# Patient Record
Sex: Male | Born: 1995
Health system: Southern US, Community
[De-identification: ages and names within clinical notes are randomized; demographics above are authoritative.]

## PROBLEM LIST (undated history)

## (undated) DIAGNOSIS — F319 Bipolar disorder, unspecified: Secondary | ICD-10-CM

## (undated) DIAGNOSIS — F329 Major depressive disorder, single episode, unspecified: Secondary | ICD-10-CM

## (undated) DIAGNOSIS — K859 Acute pancreatitis without necrosis or infection, unspecified: Secondary | ICD-10-CM

## (undated) DIAGNOSIS — F32A Depression, unspecified: Secondary | ICD-10-CM

## (undated) DIAGNOSIS — F419 Anxiety disorder, unspecified: Secondary | ICD-10-CM

## (undated) DIAGNOSIS — Z87442 Personal history of urinary calculi: Secondary | ICD-10-CM

## (undated) HISTORY — DX: Anxiety disorder, unspecified: F41.9

## (undated) HISTORY — DX: Bipolar disorder, unspecified: F31.9

## (undated) HISTORY — PX: HERNIA REPAIR: SHX51

---
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
## 2015-05-25 DIAGNOSIS — F419 Anxiety disorder, unspecified: Secondary | ICD-10-CM | POA: Diagnosis not present

## 2015-05-25 DIAGNOSIS — F319 Bipolar disorder, unspecified: Secondary | ICD-10-CM | POA: Diagnosis not present

## 2015-05-25 DIAGNOSIS — R1010 Upper abdominal pain, unspecified: Secondary | ICD-10-CM | POA: Diagnosis not present

## 2015-05-27 DIAGNOSIS — F419 Anxiety disorder, unspecified: Secondary | ICD-10-CM | POA: Diagnosis not present

## 2015-05-27 DIAGNOSIS — F319 Bipolar disorder, unspecified: Secondary | ICD-10-CM | POA: Diagnosis not present

## 2015-05-27 DIAGNOSIS — R1010 Upper abdominal pain, unspecified: Secondary | ICD-10-CM | POA: Diagnosis not present

## 2015-06-25 DIAGNOSIS — Z6821 Body mass index (BMI) 21.0-21.9, adult: Secondary | ICD-10-CM | POA: Diagnosis not present

## 2015-06-25 DIAGNOSIS — R110 Nausea: Secondary | ICD-10-CM | POA: Diagnosis not present

## 2015-06-25 DIAGNOSIS — F3189 Other bipolar disorder: Secondary | ICD-10-CM | POA: Diagnosis not present

## 2015-07-15 DIAGNOSIS — F319 Bipolar disorder, unspecified: Secondary | ICD-10-CM | POA: Diagnosis not present

## 2015-07-28 DIAGNOSIS — F3189 Other bipolar disorder: Secondary | ICD-10-CM | POA: Diagnosis not present

## 2015-08-04 DIAGNOSIS — F3189 Other bipolar disorder: Secondary | ICD-10-CM | POA: Diagnosis not present

## 2015-08-20 DIAGNOSIS — F3189 Other bipolar disorder: Secondary | ICD-10-CM | POA: Diagnosis not present

## 2015-09-01 DIAGNOSIS — F3189 Other bipolar disorder: Secondary | ICD-10-CM | POA: Diagnosis not present

## 2015-09-08 DIAGNOSIS — F319 Bipolar disorder, unspecified: Secondary | ICD-10-CM | POA: Diagnosis not present

## 2015-09-09 DIAGNOSIS — F3189 Other bipolar disorder: Secondary | ICD-10-CM | POA: Diagnosis not present

## 2015-09-29 DIAGNOSIS — F3189 Other bipolar disorder: Secondary | ICD-10-CM | POA: Diagnosis not present

## 2016-01-18 DIAGNOSIS — F319 Bipolar disorder, unspecified: Secondary | ICD-10-CM | POA: Diagnosis not present

## 2016-07-06 DIAGNOSIS — F3189 Other bipolar disorder: Secondary | ICD-10-CM | POA: Diagnosis not present

## 2016-07-06 DIAGNOSIS — F4010 Social phobia, unspecified: Secondary | ICD-10-CM | POA: Diagnosis not present

## 2016-07-31 DIAGNOSIS — F3189 Other bipolar disorder: Secondary | ICD-10-CM | POA: Diagnosis not present

## 2016-07-31 DIAGNOSIS — F4010 Social phobia, unspecified: Secondary | ICD-10-CM | POA: Diagnosis not present

## 2017-01-19 DIAGNOSIS — F4010 Social phobia, unspecified: Secondary | ICD-10-CM | POA: Diagnosis not present

## 2017-01-19 DIAGNOSIS — F3189 Other bipolar disorder: Secondary | ICD-10-CM | POA: Diagnosis not present

## 2017-03-09 DIAGNOSIS — B029 Zoster without complications: Secondary | ICD-10-CM | POA: Diagnosis not present

## 2017-03-09 DIAGNOSIS — Z6824 Body mass index (BMI) 24.0-24.9, adult: Secondary | ICD-10-CM | POA: Diagnosis not present

## 2017-04-10 DIAGNOSIS — F4010 Social phobia, unspecified: Secondary | ICD-10-CM | POA: Diagnosis not present

## 2017-04-10 DIAGNOSIS — F3189 Other bipolar disorder: Secondary | ICD-10-CM | POA: Diagnosis not present

## 2017-04-26 DIAGNOSIS — S93409A Sprain of unspecified ligament of unspecified ankle, initial encounter: Secondary | ICD-10-CM | POA: Diagnosis not present

## 2017-04-26 DIAGNOSIS — M25571 Pain in right ankle and joints of right foot: Secondary | ICD-10-CM | POA: Diagnosis not present

## 2017-05-11 DIAGNOSIS — F4010 Social phobia, unspecified: Secondary | ICD-10-CM | POA: Diagnosis not present

## 2017-05-11 DIAGNOSIS — F3189 Other bipolar disorder: Secondary | ICD-10-CM | POA: Diagnosis not present

## 2017-07-31 DIAGNOSIS — F3189 Other bipolar disorder: Secondary | ICD-10-CM | POA: Diagnosis not present

## 2017-07-31 DIAGNOSIS — F4010 Social phobia, unspecified: Secondary | ICD-10-CM | POA: Diagnosis not present

## 2018-11-14 DIAGNOSIS — Z23 Encounter for immunization: Secondary | ICD-10-CM | POA: Diagnosis not present

## 2018-12-13 DIAGNOSIS — Z20828 Contact with and (suspected) exposure to other viral communicable diseases: Secondary | ICD-10-CM | POA: Diagnosis not present

## 2019-02-14 ENCOUNTER — Emergency Department (HOSPITAL_COMMUNITY): Payer: BC Managed Care – PPO

## 2019-02-14 ENCOUNTER — Emergency Department (HOSPITAL_COMMUNITY)
Admission: EM | Admit: 2019-02-14 | Discharge: 2019-02-14 | Disposition: A | Discharge status: Home / Self Care | Payer: BC Managed Care – PPO | Attending: Emergency Medicine | Admitting: Emergency Medicine

## 2019-02-14 ENCOUNTER — Encounter (HOSPITAL_COMMUNITY): Payer: Self-pay | Admitting: *Deleted

## 2019-02-14 DIAGNOSIS — R1084 Generalized abdominal pain: Secondary | ICD-10-CM | POA: Diagnosis not present

## 2019-02-14 DIAGNOSIS — R109 Unspecified abdominal pain: Secondary | ICD-10-CM | POA: Diagnosis not present

## 2019-02-14 DIAGNOSIS — N200 Calculus of kidney: Secondary | ICD-10-CM | POA: Insufficient documentation

## 2019-02-14 DIAGNOSIS — N201 Calculus of ureter: Secondary | ICD-10-CM | POA: Insufficient documentation

## 2019-02-14 DIAGNOSIS — F419 Anxiety disorder, unspecified: Secondary | ICD-10-CM | POA: Diagnosis not present

## 2019-02-14 DIAGNOSIS — Z79899 Other long term (current) drug therapy: Secondary | ICD-10-CM | POA: Insufficient documentation

## 2019-02-14 DIAGNOSIS — N50812 Left testicular pain: Secondary | ICD-10-CM | POA: Diagnosis not present

## 2019-02-14 DIAGNOSIS — N202 Calculus of kidney with calculus of ureter: Secondary | ICD-10-CM | POA: Diagnosis not present

## 2019-02-14 DIAGNOSIS — R1032 Left lower quadrant pain: Secondary | ICD-10-CM | POA: Diagnosis not present

## 2019-02-14 LAB — CBC WITH DIFFERENTIAL/PLATELET
Abs Immature Granulocytes: 0.03 10*3/uL (ref 0.00–0.07)
Basophils Absolute: 0.1 10*3/uL (ref 0.0–0.1)
Basophils Relative: 1 %
Eosinophils Absolute: 0.1 10*3/uL (ref 0.0–0.5)
Eosinophils Relative: 2 %
HCT: 49.6 % (ref 39.0–52.0)
Hemoglobin: 16.9 g/dL (ref 13.0–17.0)
Immature Granulocytes: 0 %
Lymphocytes Relative: 35 %
Lymphs Abs: 2.8 10*3/uL (ref 0.7–4.0)
MCH: 30.1 pg (ref 26.0–34.0)
MCHC: 34.1 g/dL (ref 30.0–36.0)
MCV: 88.3 fL (ref 80.0–100.0)
Monocytes Absolute: 0.8 10*3/uL (ref 0.1–1.0)
Monocytes Relative: 10 %
Neutro Abs: 4.2 10*3/uL (ref 1.7–7.7)
Neutrophils Relative %: 52 %
Platelets: 267 10*3/uL (ref 150–400)
RBC: 5.62 MIL/uL (ref 4.22–5.81)
RDW: 12.3 % (ref 11.5–15.5)
WBC: 8 10*3/uL (ref 4.0–10.5)
nRBC: 0 % (ref 0.0–0.2)

## 2019-02-14 LAB — LIPASE, BLOOD: Lipase: 28 U/L (ref 11–51)

## 2019-02-14 LAB — URINALYSIS, ROUTINE W REFLEX MICROSCOPIC
Bilirubin Urine: NEGATIVE
Glucose, UA: NEGATIVE mg/dL
Ketones, ur: 5 mg/dL — AB
Leukocytes,Ua: NEGATIVE
Nitrite: NEGATIVE
Protein, ur: 30 mg/dL — AB
RBC / HPF: >50 RBC/hpf — ABNORMAL HIGH (ref 0–5)
Specific Gravity, Urine: 1.016 (ref 1.005–1.030)
pH: 7 (ref 5.0–8.0)

## 2019-02-14 LAB — COMPREHENSIVE METABOLIC PANEL
ALT: 56 U/L — ABNORMAL HIGH (ref 0–44)
AST: 52 U/L — ABNORMAL HIGH (ref 15–41)
Albumin: 4.6 g/dL (ref 3.5–5.0)
Alkaline Phosphatase: 86 U/L (ref 38–126)
Anion gap: 13 (ref 5–15)
BUN: 13 mg/dL (ref 6–20)
CO2: 22 mmol/L (ref 22–32)
Calcium: 9.7 mg/dL (ref 8.9–10.3)
Chloride: 105 mmol/L (ref 98–111)
Creatinine, Ser: 1.27 mg/dL — ABNORMAL HIGH (ref 0.61–1.24)
GFR calc Af Amer: >60 mL/min (ref >60)
GFR calc non Af Amer: >60 mL/min (ref >60)
Glucose, Bld: 134 mg/dL — ABNORMAL HIGH (ref 70–99)
Potassium: 5 mmol/L (ref 3.5–5.1)
Sodium: 140 mmol/L (ref 135–145)
Total Bilirubin: 1.6 mg/dL — ABNORMAL HIGH (ref 0.3–1.2)
Total Protein: 6.7 g/dL (ref 6.5–8.1)

## 2019-02-14 MED ORDER — IOHEXOL 350 MG/ML SOLN
100.0000 mL | Freq: Once | INTRAVENOUS | Status: AC | PRN
  Administered 2019-02-14: 100 mL via INTRAVENOUS

## 2019-02-14 MED ORDER — ONDANSETRON HCL 4 MG/2ML IJ SOLN
4.0000 mg | Freq: Once | INTRAMUSCULAR | Status: DC
  Filled 2019-02-14: qty 2

## 2019-02-14 MED ORDER — TAMSULOSIN HCL 0.4 MG PO CAPS: 0.4000 mg | ORAL_CAPSULE | Freq: Every day | ORAL | 0 refills | Status: AC

## 2019-02-14 MED ORDER — MORPHINE SULFATE (PF) 4 MG/ML IV SOLN
4.0000 mg | Freq: Once | INTRAVENOUS | Status: DC
  Filled 2019-02-14: qty 1

## 2019-02-14 MED ORDER — SODIUM CHLORIDE 0.9 % IV BOLUS
1000.0000 mL | Freq: Once | INTRAVENOUS | Status: AC
  Administered 2019-02-14: 12:00:00 1000 mL via INTRAVENOUS

## 2019-02-14 MED ORDER — ONDANSETRON 4 MG PO TBDP: ORAL_TABLET | ORAL | 0 refills | Status: AC

## 2019-02-14 MED ORDER — OXYCODONE-ACETAMINOPHEN 5-325 MG PO TABS: 1.0000 | ORAL_TABLET | Freq: Four times a day (QID) | ORAL | 0 refills | Status: AC | PRN

## 2019-02-14 MED ORDER — KETOROLAC TROMETHAMINE 15 MG/ML IJ SOLN
15.0000 mg | Freq: Once | INTRAMUSCULAR | Status: AC
  Administered 2019-02-14: 15 mg via INTRAVENOUS
  Filled 2019-02-14: qty 1

## 2019-02-14 NOTE — ED Provider Notes (Signed)
[facility] EMERGENCY DEPARTMENT Provider Note   CSN: [id number] Arrival date & time: 02/14/19  7782     History Chief Complaint  Patient presents with  . Abdominal Pain    Anthony Villegas is a 24 y.o. male.  Anthony Villegas is a 24 y.o. male with a history of pancreatitis, bipolar affective disorder, and depression, who presents to the emergency department for evaluation of left lower quadrant abdominal pain.  Patient states that symptoms first started yesterday with some mild pain in this area.  Patient states when he woke up this morning pain was severe and he also developed pain in his left testicle.  Pain radiates into his back as well.  Patient is crying out in pain and is very anxious.  He states he is nauseated, and has a phobia of vomiting.  He states that he took a laxative yesterday because he thought constipation was contributing to his symptoms but otherwise has not had any diarrhea or blood in the stool.  States that he has had stomach issues in the past and has been diagnosed with likely IBS but has never had abdominal pain this severe, he does not usually have testicle pain associated with his abdomen.  He denies any dysuria or urinary frequency, no hematuria, no penile discharge.  He has not noted any testicular swelling.  States that he was diagnosed with appendicitis once before but told that it was mild and did not have surgery.  Patient states that even though the pain is not on his right side he is worried he could have appendicitis again.  He has not taken anything to treat his pain prior to arrival.        Past Medical History:  Diagnosis Date  . Anxiety   . Bipolar affective disorder   . Depression    November to August talk therapy  . Pancreatitis    3 years ago episode but no problems since    Patient Active Problem List   Diagnosis Date Noted  . Constipation 01/09/2011  . Abdominal pain 01/09/2011    Past Surgical History:    Procedure Laterality Date  . HERNIA REPAIR     testical       Family History  Problem Relation Age of Onset  . Diabetes Father   . Irritable bowel syndrome Unknown   . Hypertension Father   . GER disease Unknown   . Colon cancer Unknown        ??  . Gout Father   . Arthritis Father   . Kidney Stones Father   . Diabetes Mother   . Hypertension Mother     Social History   Tobacco Use  . Smoking status: Never Smoker  Substance Use Topics  . Alcohol use: No  . Drug use: No    Home Medications Prior to Admission medications   Medication Sig Start Date End Date Taking? Authorizing Provider  ALPRAZolam Prudy Feeler) 0.25 MG tablet Take one tablet at 2:30pm on the day of the procedure. 03/18/14   Hilarie Fredrickson, MD  ARIPiprazole (ABILIFY) 10 MG tablet Take 10 mg by mouth daily.    [provider]  clidinium-chlordiazePOXIDE (LIBRAX) 5-2.5 MG per capsule TAKE ONE CAPSULE BY MOUTH 15 MINUTES BEFORE MEALS FOR ABDOMINAL PAIN AND CRAMPING 04/13/14   Hilarie Fredrickson, MD  ondansetron (ZOFRAN ODT) 4 MG disintegrating tablet Take 1 tablet (4 mg total) by mouth every 8 (eight) hours as needed for nausea or vomiting.  03/31/14   Britt Bottom, NP    Allergies    Patient has no known allergies.  Review of Systems   Review of Systems  Constitutional: Negative for chills and fever.  HENT: Negative.   Respiratory: Negative for cough and shortness of breath.   Cardiovascular: Negative for chest pain.  Gastrointestinal: Positive for abdominal pain and nausea. Negative for blood in stool, constipation, diarrhea and vomiting.  Genitourinary: Positive for testicular pain. Negative for dysuria, frequency, penile swelling and scrotal swelling.  Musculoskeletal: Positive for back pain.  Skin: Negative for color change and rash.  Neurological: Negative for dizziness, syncope and light-headedness.    Physical Exam Updated Vital Signs BP 137/88 (BP Location: Right Arm)   Pulse 86   Temp  97.6 F (36.4 C) (Oral)   Resp (!) 22   SpO2 100%   Physical Exam Vitals and nursing note reviewed. Exam conducted with a chaperone present.  Constitutional:      General: He is in acute distress.     Appearance: He is well-developed. He is not ill-appearing or diaphoretic.     Comments: Patient appears uncomfortable and somewhat distressed, crying out in pain but is not ill or toxic appearing  HENT:     Head: Normocephalic and atraumatic.  Eyes:     General:        Right eye: No discharge.        Left eye: No discharge.     Pupils: Pupils are equal, round, and reactive to light.  Cardiovascular:     Rate and Rhythm: Normal rate and regular rhythm.     Heart sounds: Normal heart sounds.  Pulmonary:     Effort: Pulmonary effort is normal. No respiratory distress.     Breath sounds: Normal breath sounds. No wheezing or rales.     Comments: Respirations equal and unlabored, patient able to speak in full sentences, lungs clear to auscultation bilaterally Abdominal:     General: Bowel sounds are normal. There is no distension.     Palpations: Abdomen is soft. There is no mass.     Tenderness: There is generalized abdominal tenderness and tenderness in the left lower quadrant. There is no guarding.     Comments: Abdomen is soft and nondistended, bowel sounds present throughout, patient endorses some generalized tenderness in all quadrants but cries out with palpation in the left lower quadrant with some voluntary guarding noted  Genitourinary:    Comments: Penis normal, testicles and scrotum without erythema or swelling, patient does endorse some pain with tenderness over the left testicle, no palpable masses. Musculoskeletal:        General: No deformity.     Cervical back: Neck supple.  Skin:    General: Skin is warm and dry.     Capillary Refill: Capillary refill takes less than 2 seconds.  Neurological:     Mental Status: He is alert.     Coordination: Coordination normal.      Comments: Speech is clear, able to follow commands Moves extremities without ataxia, coordination intact  Psychiatric:        Mood and Affect: Mood normal.        Behavior: Behavior normal.     ED Results / Procedures / Treatments   Labs (all labs ordered are listed, but only abnormal results are displayed) Labs Reviewed  COMPREHENSIVE METABOLIC PANEL - Abnormal; Notable for the following components:      Result Value   Glucose, Bld 134 (*)  Creatinine, Ser 1.27 (*)    AST 52 (*)    ALT 56 (*)    Total Bilirubin 1.6 (*)    All other components within normal limits  LIPASE, BLOOD  CBC WITH DIFFERENTIAL/PLATELET  URINALYSIS, ROUTINE W REFLEX MICROSCOPIC    EKG None  Radiology CT Abdomen Pelvis W Contrast  Result Date: 02/14/2019 CLINICAL DATA:  Left lower abdominal pain EXAM: CT ABDOMEN AND PELVIS WITH CONTRAST TECHNIQUE: Multidetector CT imaging of the abdomen and pelvis was performed using the standard protocol following bolus administration of intravenous contrast. CONTRAST:  OMNIPAQUE IOHEXOL 350 MG/ML SOLN COMPARISON:  January 12, 2013 FINDINGS: Lower chest: Lung bases are clear. Hepatobiliary: There is hepatic steatosis. No focal liver lesions are evident. The gallbladder wall is not appreciably thickened. There is no biliary duct dilatation. Pancreas: There is no pancreatic mass or inflammatory focus. Spleen: No splenic lesions are evident. Adrenals/Urinary Tract: Adrenals bilaterally appear unremarkable. Kidneys bilaterally show no evident mass. There is no appreciable hydronephrosis on either side. There is a 1 mm calculus in the mid left kidney with a second 1 mm calculus in the lower pole left kidney. There is a calculus in the proximal left ureter slightly beyond the left ureteropelvic junction measuring 3 x 2 mm. There is subtle edema of the left ureter at the site of the calculus. No other ureteral calculi are evident on either side. Urinary bladder is midline with  wall thickness within normal limits. Stomach/Bowel: There is no appreciable bowel wall or mesenteric thickening. There is no evident bowel obstruction. The terminal ileum appears unremarkable. There is no demonstrable free air or portal venous air. Vascular/Lymphatic: There is no abdominal aortic aneurysm. No vascular lesions are evident. Major venous structures are patent. There is no adenopathy in the abdomen or pelvis. Reproductive: Prostate and seminal vesicles are normal in size and contour. No evident pelvic mass. Other: Appendix appears unremarkable. No abscess or ascites is evident in the abdomen or pelvis. Musculoskeletal: No blastic or lytic bone lesions. 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 No intramuscular or abdominal wall lesions are evident. IMPRESSION: 1. 3 x 2 mm calculus in the left ureter slightly beyond the left ureteropelvic junction. There is slight edema involving the left ureter at this site. There is no appreciable hydronephrosis, however. 2. There is a 1 mm calculus in the mid left kidney as well as a 1 mm calculus in the lower pole left kidney. 3. No bowel obstruction. No abscess in the abdomen or pelvis. Appendix appears normal. 4.  Hepatic steatosis. 5.  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 Electronically Signed   By: Bretta Bang III M.D.   On: 02/14/2019 12:12   US SCROTUM W/DOPPLER  Result Date: 02/14/2019 CLINICAL DATA:  24 year old male with left testicular pain since yesterday. EXAM: SCROTAL ULTRASOUND DOPPLER ULTRASOUND OF THE TESTICLES TECHNIQUE: Complete ultrasound examination of the testicles, epididymis, and other scrotal structures was performed. Color and spectral Doppler ultrasound were also utilized to evaluate blood flow to the testicles. COMPARISON:  CT Abdomen and Pelvis 01/12/2013. FINDINGS: Right testicle Measurements: 4.7 x 2.3 x 2.5 centimeters. Mild microlithiasis (image 15). Otherwise normal testicular echotexture. Left  testicle Measurements: 4.5 x 2.4 x 2.8 centimeters cyst. Mild microlithiasis similar to the right side (image 33) with otherwise normal echotexture. Right epididymis:  Normal in size and appearance. Left epididymis:  Normal in size and appearance. Hydrocele:  None visualized. Varicocele:  None visualized. Pulsed Doppler interrogation of both testes  demonstrates normal low resistance arterial and venous waveforms bilaterally. IMPRESSION: No evidence of testicular torsion and normal scrotal ultrasound aside from mild bilateral testicular microlithiasis. Current literature suggests that testicular microlithiasis is not a significant independent risk factor for development of testicular carcinoma, and that follow up imaging is not warranted in the absence of other risk factors. Monthly testicular self-examination and annual physical exams are considered appropriate surveillance. If patient has other risk factors for testicular carcinoma, then referral to Urology should be considered. (Reference: DeCastro, et al.: A 5-Year Follow up Study of Asymptomatic Men with Testicular Microlithiasis. J Urol 2008; 179:1420-1423.) Electronically Signed   By: Odessa Fleming M.D.   On: 02/14/2019 11:08    Procedures Procedures (including critical care time)  Medications Ordered in ED Medications  sodium chloride 0.9 % bolus 1,000 mL (has no administration in time range)  ondansetron (ZOFRAN) injection 4 mg (has no administration in time range)  morphine 4 MG/ML injection 4 mg (has no administration in time range)    ED Course  I have reviewed the triage vital signs and the nursing notes.  Pertinent labs & imaging results that were available during my care of the patient were reviewed by me and considered in my medical decision making (see chart for details).  Clinical Course as of Feb 13 1230  Fri Feb 14, 2019  5730 25 year old male presents with left lower quadrant abdominal pain and left-sided testicular pain, pain  radiates to the back.  Patient has generalized abdominal tenderness throughout that is worse in the left lower quadrant with mild guarding he also has some testicular tenderness.  Differential includes testicular torsion, kidney stone, diverticulitis, pancreatitis, given location feel appendicitis is less likely, will get CT abdomen pelvis as well as testicular ultrasound and labs.  Pain medicine ordered for symptom management.   [KF]  1040 No leukocytosis  CBC with Differential [KF]  1117 Slight bump in creatinine, 1.27 usually 0.9, patient also has mildly elevated LFTs and T bili, no significant electrolyte derangements  Comprehensive metabolic panel(!) [KF]  1117 Normal lipase, does not suggest pancreatitis  Lipase, blood [KF]  1118 No evidence of testicular torsion or other acute abnormalities  US SCROTUM W/DOPPLER [KF]  1216 CT shows a 3 x 2 mm kidney stone just beyond the left ureteropelvic junction that is likely the cause of patient's symptoms.  Initial wave of severe pain the patient was experiencing in triage seems to have eased off and patient declined morphine, will order small dose of Toradol given slight bump in creatinine.  Awaiting urinalysis  CT Abdomen Pelvis W Contrast [KF]    Clinical Course User Index [KF] Dartha Lodge, PA-C   MDM Rules/Calculators/A&P                      Pt has been diagnosed with a Kidney Stone via CT. There is no evidence of significant hydronephrosis, serum creatinine minimally elevated, vitals sign stable and the pt does not have irratractable vomiting. Pt will be dc home with pain medications & has been advised to follow up with urology.   Final Clinical Impression(s) / ED Diagnoses Final diagnoses:  Left ureteral stone  Kidney stone on left side    Rx / DC Orders ED Discharge Orders         Ordered    tamsulosin (FLOMAX) 0.4 MG CAPS capsule  Daily     02/14/19 1258    ondansetron (ZOFRAN ODT) 4 MG disintegrating tablet  02/14/19 1258     oxyCODONE-acetaminophen (PERCOCET) 5-325 MG tablet  Every 6 hours PRN     02/14/19 1258           Dartha LodgeFord, Aldora Perman N, PA-C 02/14/19 1323    Virgina Norfolkuratolo, Rourke, DO 02/14/19 1557

## 2019-02-14 NOTE — ED Triage Notes (Signed)
LLQ pain since yesterday. No vomiting. Pt crying out in pain. Pain is constant with periods of more intensity. Pt took a laxative last night. Also complains of back pain

## 2019-02-14 NOTE — Discharge Instructions (Signed)
You have a left-sided kidney stone that is causing your symptoms, your scan also showed 2 smaller stones within the left kidney, your lab work looks good aside from a slight elevation in your kidney function this should be rechecked in about a week.  Please follow-up with urology regarding the stones.  This should pass easily on its own over the next few days.  Use prescribed pain medication and nausea medication for symptom management and take Flomax daily to help ease the passing of stone.  Return to the ED if you develop fevers, worsening pain despite pain medication, persistent vomiting or any other new or concerning symptoms.

## 2019-03-07 ENCOUNTER — Other Ambulatory Visit (HOSPITAL_COMMUNITY)
Admission: RE | Admit: 2019-03-07 | Discharge: 2019-03-07 | Disposition: A | Discharge status: Home / Self Care | Payer: BC Managed Care – PPO | Source: Ambulatory Visit | Attending: Urology | Admitting: Urology

## 2019-03-07 ENCOUNTER — Other Ambulatory Visit: Payer: Self-pay

## 2019-03-07 ENCOUNTER — Other Ambulatory Visit: Payer: Self-pay | Admitting: Urology

## 2019-03-07 ENCOUNTER — Encounter (HOSPITAL_BASED_OUTPATIENT_CLINIC_OR_DEPARTMENT_OTHER): Payer: Self-pay | Admitting: Urology

## 2019-03-07 DIAGNOSIS — Z20822 Contact with and (suspected) exposure to covid-19: Secondary | ICD-10-CM | POA: Insufficient documentation

## 2019-03-07 DIAGNOSIS — N201 Calculus of ureter: Secondary | ICD-10-CM | POA: Diagnosis not present

## 2019-03-07 DIAGNOSIS — Z01812 Encounter for preprocedural laboratory examination: Secondary | ICD-10-CM | POA: Diagnosis not present

## 2019-03-07 DIAGNOSIS — N202 Calculus of kidney with calculus of ureter: Secondary | ICD-10-CM | POA: Diagnosis not present

## 2019-03-07 DIAGNOSIS — N200 Calculus of kidney: Secondary | ICD-10-CM

## 2019-03-07 LAB — SARS CORONAVIRUS 2 (TAT 6-24 HRS): SARS Coronavirus 2: NEGATIVE

## 2019-03-10 ENCOUNTER — Encounter (HOSPITAL_BASED_OUTPATIENT_CLINIC_OR_DEPARTMENT_OTHER): Payer: Self-pay | Admitting: Urology

## 2019-03-10 ENCOUNTER — Other Ambulatory Visit: Payer: Self-pay

## 2019-03-10 ENCOUNTER — Encounter (HOSPITAL_BASED_OUTPATIENT_CLINIC_OR_DEPARTMENT_OTHER)
Admission: RE | Disposition: A | Discharge status: Home / Self Care | Payer: Self-pay | Source: Home / Self Care | Attending: Urology

## 2019-03-10 ENCOUNTER — Ambulatory Visit (HOSPITAL_BASED_OUTPATIENT_CLINIC_OR_DEPARTMENT_OTHER)
Admission: RE | Admit: 2019-03-10 | Discharge: 2019-03-10 | Disposition: A | Discharge status: Home / Self Care | Payer: BC Managed Care – PPO | Attending: Urology | Admitting: Urology

## 2019-03-10 ENCOUNTER — Ambulatory Visit (HOSPITAL_COMMUNITY): Payer: BC Managed Care – PPO

## 2019-03-10 DIAGNOSIS — N200 Calculus of kidney: Secondary | ICD-10-CM

## 2019-03-10 DIAGNOSIS — N201 Calculus of ureter: Secondary | ICD-10-CM | POA: Diagnosis not present

## 2019-03-10 DIAGNOSIS — N23 Unspecified renal colic: Secondary | ICD-10-CM | POA: Diagnosis not present

## 2019-03-10 DIAGNOSIS — Z01818 Encounter for other preprocedural examination: Secondary | ICD-10-CM | POA: Diagnosis not present

## 2019-03-10 DIAGNOSIS — N135 Crossing vessel and stricture of ureter without hydronephrosis: Secondary | ICD-10-CM

## 2019-03-10 HISTORY — DX: Personal history of urinary calculi: Z87.442

## 2019-03-10 HISTORY — PX: EXTRACORPOREAL SHOCK WAVE LITHOTRIPSY: SHX1557

## 2019-03-10 SURGERY — LITHOTRIPSY, ESWL
Anesthesia: LOCAL | Laterality: Left

## 2019-03-10 MED ORDER — DIPHENHYDRAMINE HCL 25 MG PO CAPS
ORAL_CAPSULE | ORAL | Status: AC
  Filled 2019-03-10: qty 1

## 2019-03-10 MED ORDER — CIPROFLOXACIN HCL 500 MG PO TABS
500.0000 mg | ORAL_TABLET | ORAL | Status: AC
  Administered 2019-03-10: 500 mg via ORAL
  Filled 2019-03-10: qty 1

## 2019-03-10 MED ORDER — DIAZEPAM 5 MG PO TABS
10.0000 mg | ORAL_TABLET | ORAL | Status: AC
  Administered 2019-03-10: 10 mg via ORAL
  Filled 2019-03-10: qty 2

## 2019-03-10 MED ORDER — DIPHENHYDRAMINE HCL 25 MG PO CAPS
25.0000 mg | ORAL_CAPSULE | ORAL | Status: AC
  Administered 2019-03-10: 25 mg via ORAL
  Filled 2019-03-10: qty 1

## 2019-03-10 MED ORDER — SODIUM CHLORIDE 0.9 % IV SOLN
INTRAVENOUS | Status: DC
  Filled 2019-03-10: qty 1000

## 2019-03-10 MED ORDER — DIAZEPAM 5 MG PO TABS
ORAL_TABLET | ORAL | Status: AC
  Filled 2019-03-10: qty 2

## 2019-03-10 MED ORDER — CIPROFLOXACIN HCL 500 MG PO TABS
ORAL_TABLET | ORAL | Status: AC
  Filled 2019-03-10: qty 1

## 2019-03-10 NOTE — H&P (Signed)
H&P  Chief Complaint: Lt kidney stone  History of Present Illness: 24 yo male presents for ESL  Of a 4 mm lt proximal ureteral stone.  Past Medical History:  Diagnosis Date  . Anxiety   . Bipolar affective disorder (HCC)   . Depression    November to August talk therapy  . Pancreatitis    3 years ago episode but no problems since    Past Surgical History:  Procedure Laterality Date  . HERNIA REPAIR     testical    Home Medications:  Allergies as of 03/10/2019   No Known Allergies     Medication List    Notice   Cannot display discharge medications because the patient has not yet been admitted.     Allergies: No Known Allergies  Family History  Problem Relation Age of Onset  . Diabetes Father   . Hypertension Father   . Gout Father   . Arthritis Father   . Kidney Stones Father   . Irritable bowel syndrome Other   . GER disease Other   . Colon cancer Other        ??  . Diabetes Mother   . Hypertension Mother     Social History:  reports that he has never smoked. He has never used smokeless tobacco. He reports that he does not drink alcohol or use drugs.  ROS: A complete review of systems was performed.  All systems are negative except for pertinent findings as noted.  Physical Exam:  Vital signs in last 24 hours: There were no vitals taken for this visit. Constitutional:  Alert and oriented, No acute distress Cardiovascular: Regular rate  Respiratory: Normal respiratory effort GI: Abdomen is soft, nontender, nondistended, no abdominal masses. No CVAT.  Genitourinary: Normal male phallus, testes are descended bilaterally and non-tender and without masses, scrotum is normal in appearance without lesions or masses, perineum is normal on inspection. Lymphatic: No lymphadenopathy Neurologic: Grossly intact, no focal deficits Psychiatric: Normal mood and affect  Laboratory Data:  No results for input(s): WBC, HGB, HCT, PLT in the last 72 hours.  No results  for input(s): NA, K, CL, GLUCOSE, BUN, CALCIUM, CREATININE in the last 72 hours.  Invalid input(s): CO3   No results found for this or any previous visit (from the past 24 hour(s)). Recent Results (from the past 240 hour(s))  SARS CORONAVIRUS 2 (TAT 6-24 HRS) Nasopharyngeal Nasopharyngeal Swab     Status: None   Collection Time: 03/07/19  2:20 PM   Specimen: Nasopharyngeal Swab  Result Value Ref Range Status   SARS Coronavirus 2 NEGATIVE NEGATIVE Final    Comment: (NOTE) SARS-CoV-2 target nucleic acids are NOT DETECTED. The SARS-CoV-2 RNA is generally detectable in upper and lower respiratory specimens during the acute phase of infection. Negative results do not preclude SARS-CoV-2 infection, do not rule out co-infections with other pathogens, and should not be used as the sole basis for treatment or other patient management decisions. Negative results must be combined with clinical observations, patient history, and epidemiological information. The expected result is Negative. Fact Sheet for Patients: HairSlick.no Fact Sheet for Healthcare Providers: quierodirigir.com This test is not yet approved or cleared by the [place] FDA and  has been authorized for detection and/or diagnosis of SARS-CoV-2 by FDA under an Emergency Use Authorization (EUA). This EUA will remain  in effect (meaning this test can be used) for the duration of the COVID-19 declaration under Section 56 4(b)(1) of the Act, 21 U.S.C. section  360bbb-3(b)(1), unless the authorization is terminated or revoked sooner. Performed at [facility], [place] [address], [place], [place] [address]     Renal Function: No results for input(s): CREATININE in the last 168 hours. CrCl cannot be calculated (Patient's most recent lab result is older than the maximum 21 days allowed.).  Radiologic Imaging: No results found.  Impression/Assessment:  Lt proximal  ureteral stone  Plan:  ESL. The risks, benefits and alternatives of LEFT ESWL was discussed with the patient. I described the risks which include arrhythmia, kidney contusion, kidney hemorrhage, need for transfusion, back discomfort, flank ecchymosis, flank abrasion, inability to break up stone, inability to pass stone fragments, Steinstrasse, infection associated with obstructing stones, need for different surgical procedure and possible need for repeat shockwave lithotripsy.  He desires to proceed.

## 2019-03-10 NOTE — Discharge Instructions (Signed)
Post Anesthesia Home Care Instructions  Activity: Get plenty of rest for the remainder of the day. A responsible adult should stay with you for 24 hours following the procedure.  For the next 24 hours, DO NOT: -Drive a car -Operate machinery -Drink alcoholic beverages -Take any medication unless instructed by your physician -Make any legal decisions or sign important papers.  Meals: Start with liquid foods such as gelatin or soup. Progress to regular foods as tolerated. Avoid greasy, spicy, heavy foods. If nausea and/or vomiting occur, drink only clear liquids until the nausea and/or vomiting subsides. Call your physician if vomiting continues.  Special Instructions/Symptoms: Your throat may feel dry or sore from the anesthesia or the breathing tube placed in your throat during surgery. If this causes discomfort, gargle with warm salt water. The discomfort should disappear within 24 hours.  If you had a scopolamine patch placed behind your ear for the management of post- operative nausea and/or vomiting:  1. The medication in the patch is effective for 72 hours, after which it should be removed.  Wrap patch in a tissue and discard in the trash. Wash hands thoroughly with soap and water. 2. You may remove the patch earlier than 72 hours if you experience unpleasant side effects which may include dry mouth, dizziness or visual disturbances. 3. Avoid touching the patch. Wash your hands with soap and water after contact with the patch.    Lithotripsy, Care After This sheet gives you information about how to care for yourself after your procedure. Your health care provider may also give you more specific instructions. If you have problems or questions, contact your health care provider. What can I expect after the procedure? After the procedure, it is common to have:  Some blood in your urine. This should only last for a few days.  Soreness in your back, sides, or upper abdomen for a few  days.  Blotches or bruises on your back where the pressure wave entered the skin.  Pain, discomfort, or nausea when pieces (fragments) of the kidney stone move through the tube that carries urine from the kidney to the bladder (ureter). Stone fragments may pass soon after the procedure, but they may continue to pass for up to 4-8 weeks. ? If you have severe pain or nausea, contact your health care provider. This may be caused by a large stone that was not broken up, and this may mean that you need more treatment.  Some pain or discomfort during urination.  Some pain or discomfort in the lower abdomen or (in men) at the base of the penis. Follow these instructions at home: Medicines  Take over-the-counter and prescription medicines only as told by your health care provider.  If you were prescribed an antibiotic medicine, take it as told by your health care provider. Do not stop taking the antibiotic even if you start to feel better.  Do not drive for 24 hours if you were given a medicine to help you relax (sedative).  Do not drive or use heavy machinery while taking prescription pain medicine. Eating and drinking      Drink enough water and fluids to keep your urine clear or pale yellow. This helps any remaining pieces of the stone to pass. It can also help prevent new stones from forming.  Eat plenty of fresh fruits and vegetables.  Follow instructions from your health care provider about eating and drinking restrictions. You may be instructed: ? To reduce how much salt (sodium) you   eat or drink. Check ingredients and nutrition facts on packaged foods and beverages. ? To reduce how much meat you eat.  Eat the recommended amount of calcium for your age and gender. Ask your health care provider how much calcium you should have. General instructions  Get plenty of rest.  Most people can resume normal activities 1-2 days after the procedure. Ask your health care provider what  activities are safe for you.  Your health care provider may direct you to lie in a certain position (postural drainage) and tap firmly (percuss) over your kidney area to help stone fragments pass. Follow instructions as told by your health care provider.  If directed, strain all urine through the strainer that was provided by your health care provider. ? Keep all fragments for your health care provider to see. Any stones that are found may be sent to a medical lab for examination. The stone may be as small as a grain of salt.  Keep all follow-up visits as told by your health care provider. This is important. Contact a health care provider if:  You have pain that is severe or does not get better with medicine.  You have nausea that is severe or does not go away.  You have blood in your urine longer than your health care provider told you to expect.  You have more blood in your urine.  You have pain during urination that does not go away.  You urinate more frequently than usual and this does not go away.  You develop a rash or any other possible signs of an allergic reaction. Get help right away if:  You have severe pain in your back, sides, or upper abdomen.  You have severe pain while urinating.  Your urine is very dark red.  You have blood in your stool (feces).  You cannot pass any urine at all.  You feel a strong urge to urinate after emptying your bladder.  You have a fever or chills.  You develop shortness of breath, difficulty breathing, or chest pain.  You have severe nausea that leads to persistent vomiting.  You faint. Summary  After this procedure, it is common to have some pain, discomfort, or nausea when pieces (fragments) of the kidney stone move through the tube that carries urine from the kidney to the bladder (ureter). If this pain or nausea is severe, however, you should contact your health care provider.  Most people can resume normal activities 1-2  days after the procedure. Ask your health care provider what activities are safe for you.  Drink enough water and fluids to keep your urine clear or pale yellow. This helps any remaining pieces of the stone to pass, and it can help prevent new stones from forming.  If directed, strain your urine and keep all fragments for your health care provider to see. Fragments or stones may be as small as a grain of salt.  Get help right away if you have severe pain in your back, sides, or upper abdomen or have severe pain while urinating. This information is not intended to replace advice given to you by your health care provider. Make sure you discuss any questions you have with your health care provider. Document Revised: 05/06/2018 Document Reviewed: 12/15/2015 Elsevier Patient Education  2020 [employer] See [facility] discharge instructions in chart. 

## 2019-03-10 NOTE — Op Note (Signed)
See [facility] note scanned into chart. 

## 2019-05-08 DIAGNOSIS — L700 Acne vulgaris: Secondary | ICD-10-CM | POA: Diagnosis not present

## 2019-09-05 DIAGNOSIS — Z20822 Contact with and (suspected) exposure to covid-19: Secondary | ICD-10-CM | POA: Diagnosis not present

## 2020-01-05 DIAGNOSIS — Z20822 Contact with and (suspected) exposure to covid-19: Secondary | ICD-10-CM | POA: Diagnosis not present

## 2020-02-20 DIAGNOSIS — Z20828 Contact with and (suspected) exposure to other viral communicable diseases: Secondary | ICD-10-CM | POA: Diagnosis not present

## 2020-02-20 DIAGNOSIS — R059 Cough, unspecified: Secondary | ICD-10-CM | POA: Diagnosis not present

## 2020-02-20 DIAGNOSIS — B349 Viral infection, unspecified: Secondary | ICD-10-CM | POA: Diagnosis not present

## 2021-03-16 IMAGING — CT CT ABD-PELV W/ CM
2 of 4 series · 16 of 46 positions shown, 18 images · IV contrast (Omni 300)
Comparison: January 12, 2013

CLINICAL DATA: Left lower abdominal pain

EXAM:
CT ABDOMEN AND PELVIS WITH CONTRAST
TECHNIQUE: Multidetector CT imaging of the abdomen and pelvis was performed
using the standard protocol following bolus administration of
intravenous contrast.
CONTRAST:  100mL OMNIPAQUE IOHEXOL 350 MG/ML SOLN

[Series 3: a/p w/ 5mm · axial · 0.77mm/px · z∈[+678,+1168]mm · 13 of 108 slices shown, 15 images]
[im 5/108  soft-tissue]
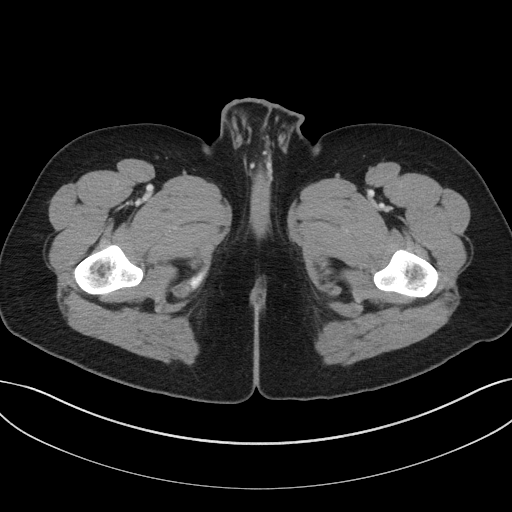
[im 5/108  bone]
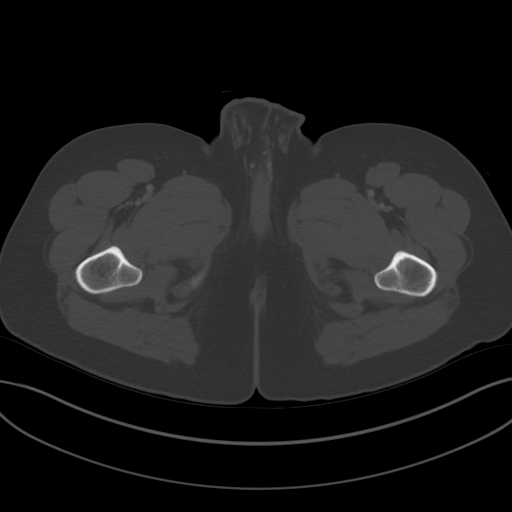
[im 13/108  soft-tissue]
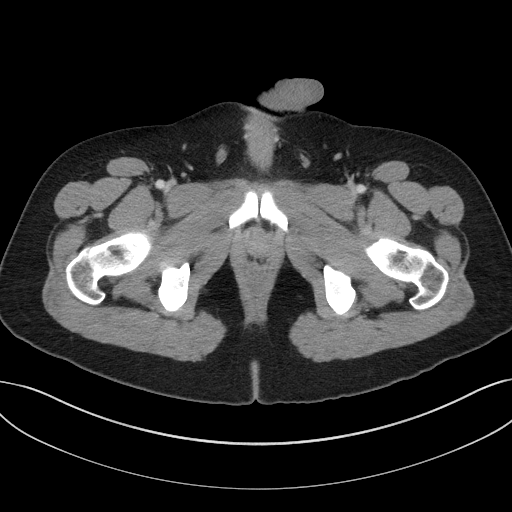
[im 21/108  soft-tissue]
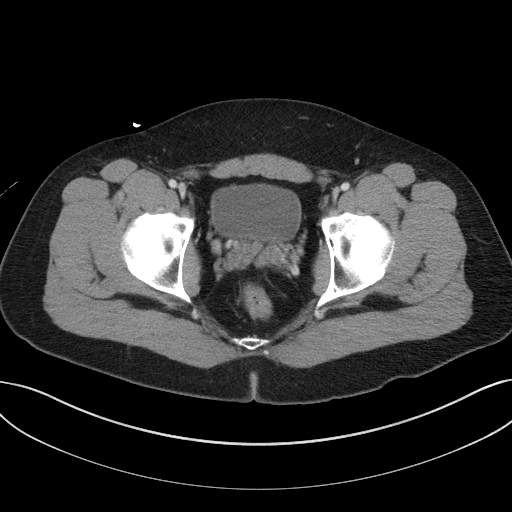
[im 29/108  soft-tissue]
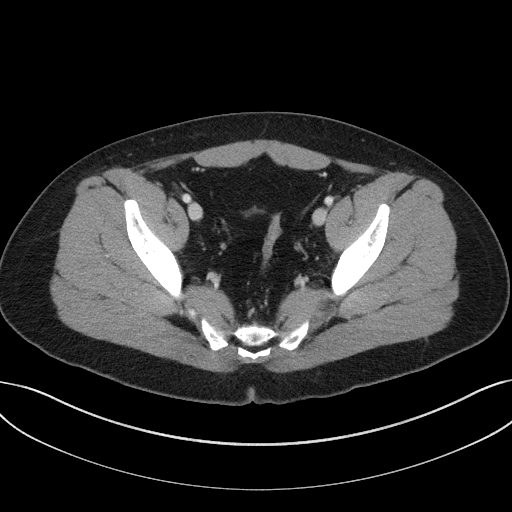
[im 38/108  soft-tissue]
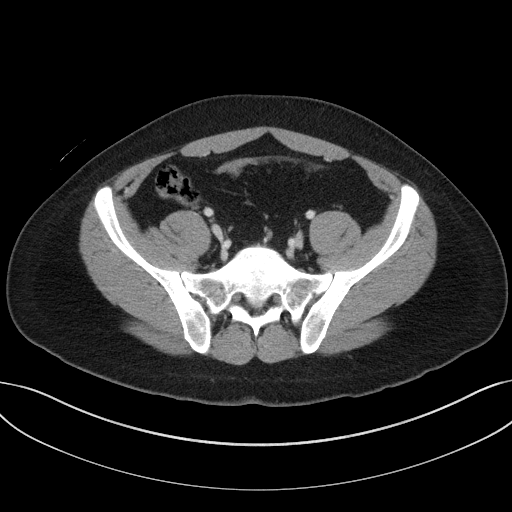
[im 46/108  soft-tissue]
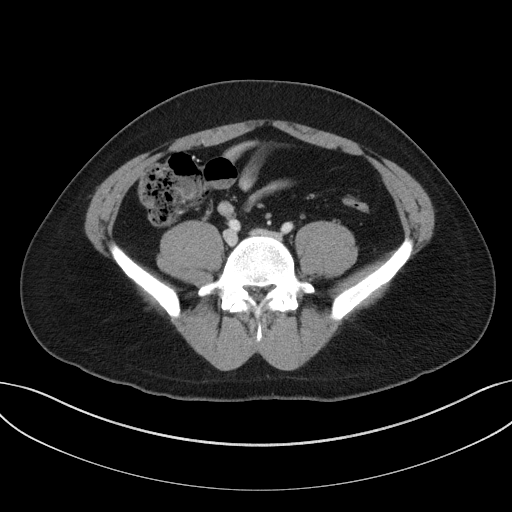
[im 54/108  soft-tissue]
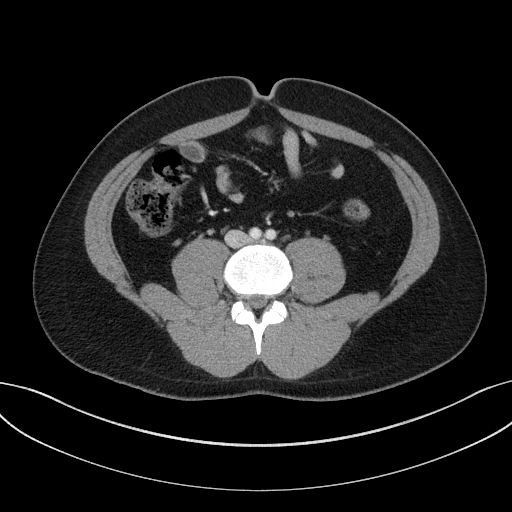
[im 62/108  soft-tissue]
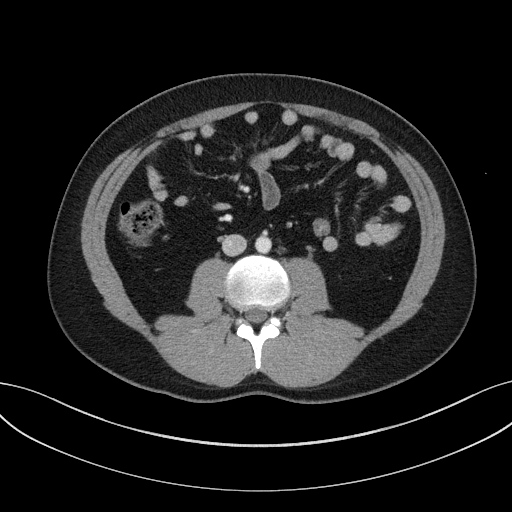
[im 70/108  soft-tissue]
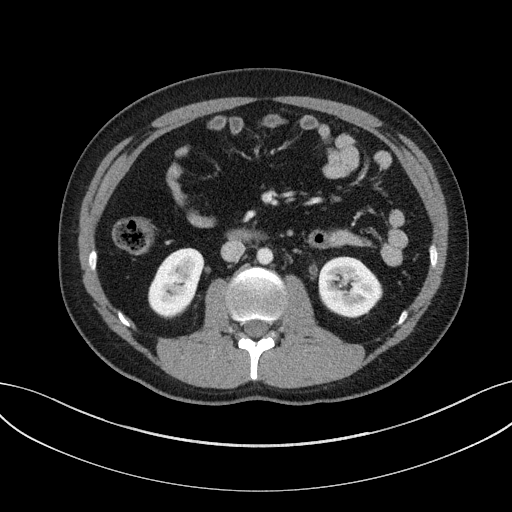
[im 70/108  bone]
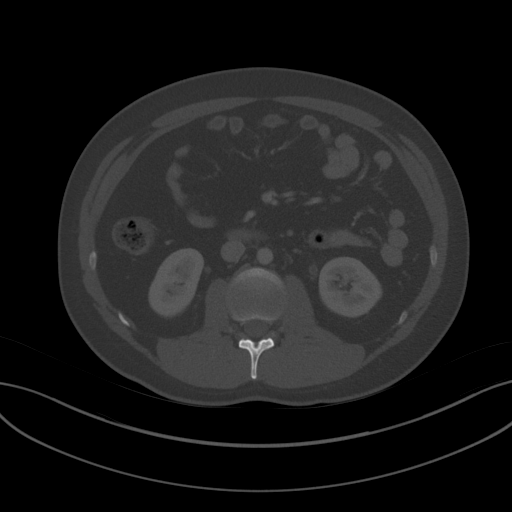
[im 79/108  soft-tissue]
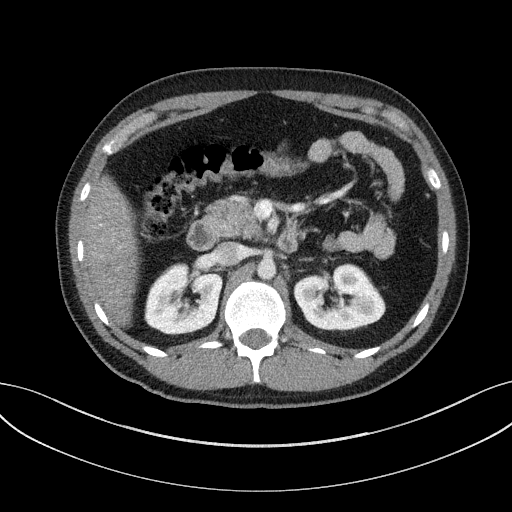
[im 87/108  soft-tissue]
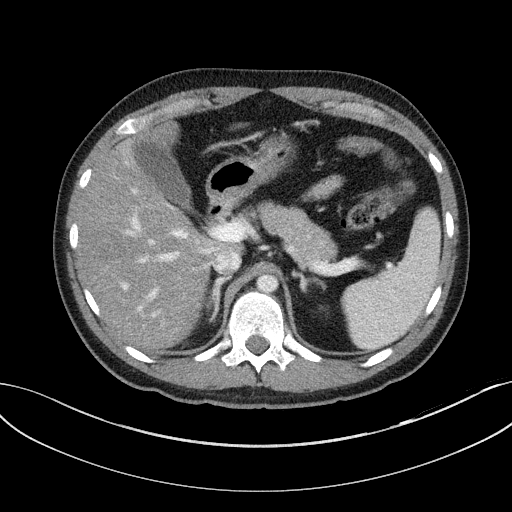
[im 95/108  soft-tissue]
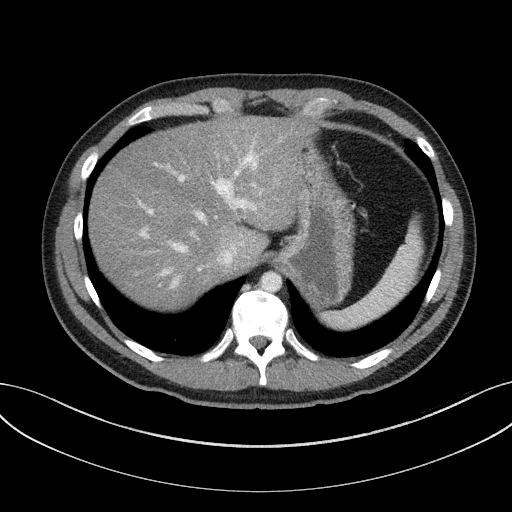
[im 103/108  soft-tissue]
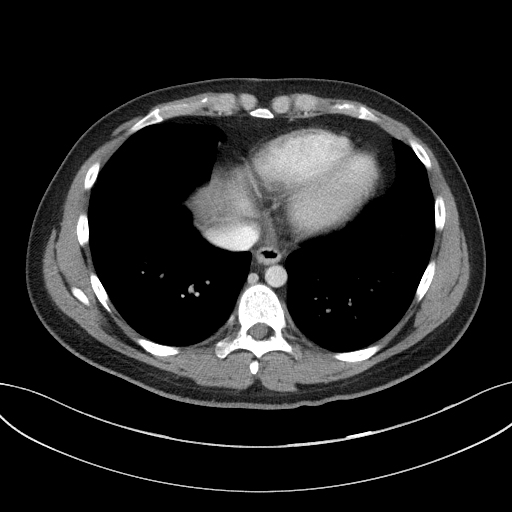

[Series 6: a/p w/ cor · coronal · 0.88mm/px · 3 of 156 slices shown]
[im 52/156  soft-tissue]
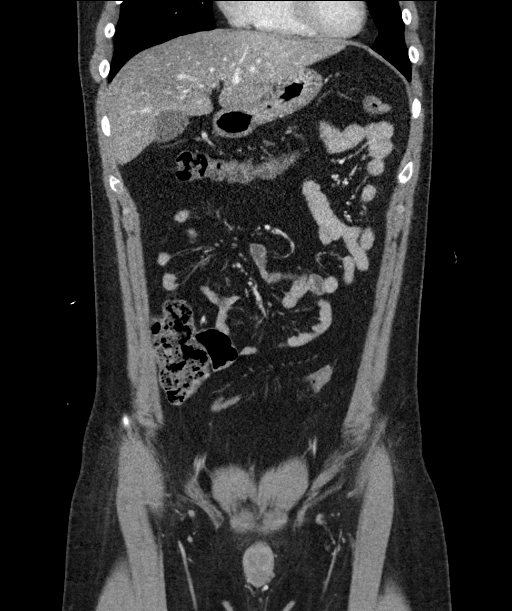
[im 69/156  soft-tissue]
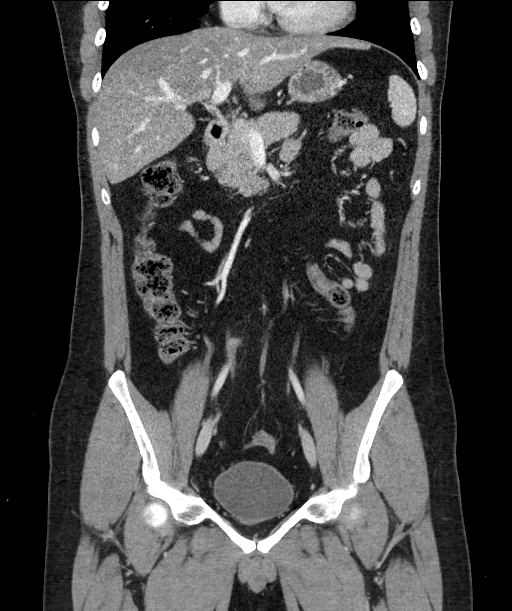
[im 87/156  soft-tissue]
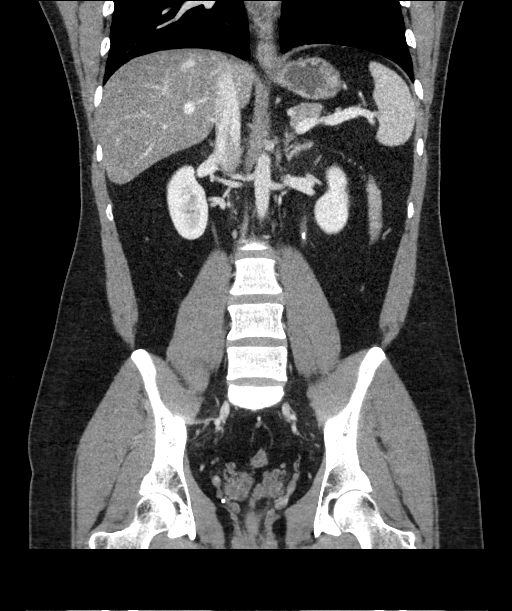

[16 of 46 positions shown; findings below may reference images not displayed]

FINDINGS: Lower chest: Lung bases are clear.

Hepatobiliary: There is hepatic steatosis. No focal liver lesions
are evident. The gallbladder wall is not appreciably thickened.
There is no biliary duct dilatation.

Pancreas: There is no pancreatic mass or inflammatory focus.

Spleen: No splenic lesions are evident.

Adrenals/Urinary Tract: Adrenals bilaterally appear unremarkable.
Kidneys bilaterally show no evident mass. There is no appreciable
hydronephrosis on either side. There is a 1 mm calculus in the mid
left kidney with a second 1 mm calculus in the lower pole left
kidney. There is a calculus in the proximal left ureter slightly
beyond the left ureteropelvic junction measuring 3 x 2 mm. There is
subtle edema of the left ureter at the site of the calculus. No
other ureteral calculi are evident on either side. Urinary bladder
is midline with wall thickness within normal limits.

Stomach/Bowel: There is no appreciable bowel wall or mesenteric
thickening. There is no evident bowel obstruction. The terminal
ileum appears unremarkable. There is no demonstrable free air or
portal venous air.

Vascular/Lymphatic: There is no abdominal aortic aneurysm. No
vascular lesions are evident. Major venous structures are patent.
There is no adenopathy in the abdomen or pelvis.

Reproductive: Prostate and seminal vesicles are normal in size and
contour. No evident pelvic mass.

Other: Appendix appears unremarkable. No abscess or ascites is
evident in the abdomen or pelvis.

Musculoskeletal: No blastic or lytic bone lesions. There is apparent
congenital ankylosis at T9-10 and T10-11. No intramuscular or
abdominal wall lesions are evident.
IMPRESSION: 1. 3 x 2 mm calculus in the left ureter slightly beyond the left
ureteropelvic junction. There is slight edema involving the left
ureter at this site. There is no appreciable hydronephrosis,
however.

2. There is a 1 mm calculus in the mid left kidney as well as a 1 mm
calculus in the lower pole left kidney.

3. No bowel obstruction. No abscess in the abdomen or pelvis.
Appendix appears normal.

4.  Hepatic steatosis.

5.  Apparent congenital ankylosis at T9-10 and T11.

## 2021-04-09 IMAGING — DX DG ABDOMEN 1V
1 series · 1 of 1 positions shown · non-contrast
Comparison: 03/07/2019 and previous

CLINICAL DATA: Left nephrolithiasis, preop planning for lithotripsy

EXAM:
ABDOMEN - 1 VIEW

[abdomen kub]
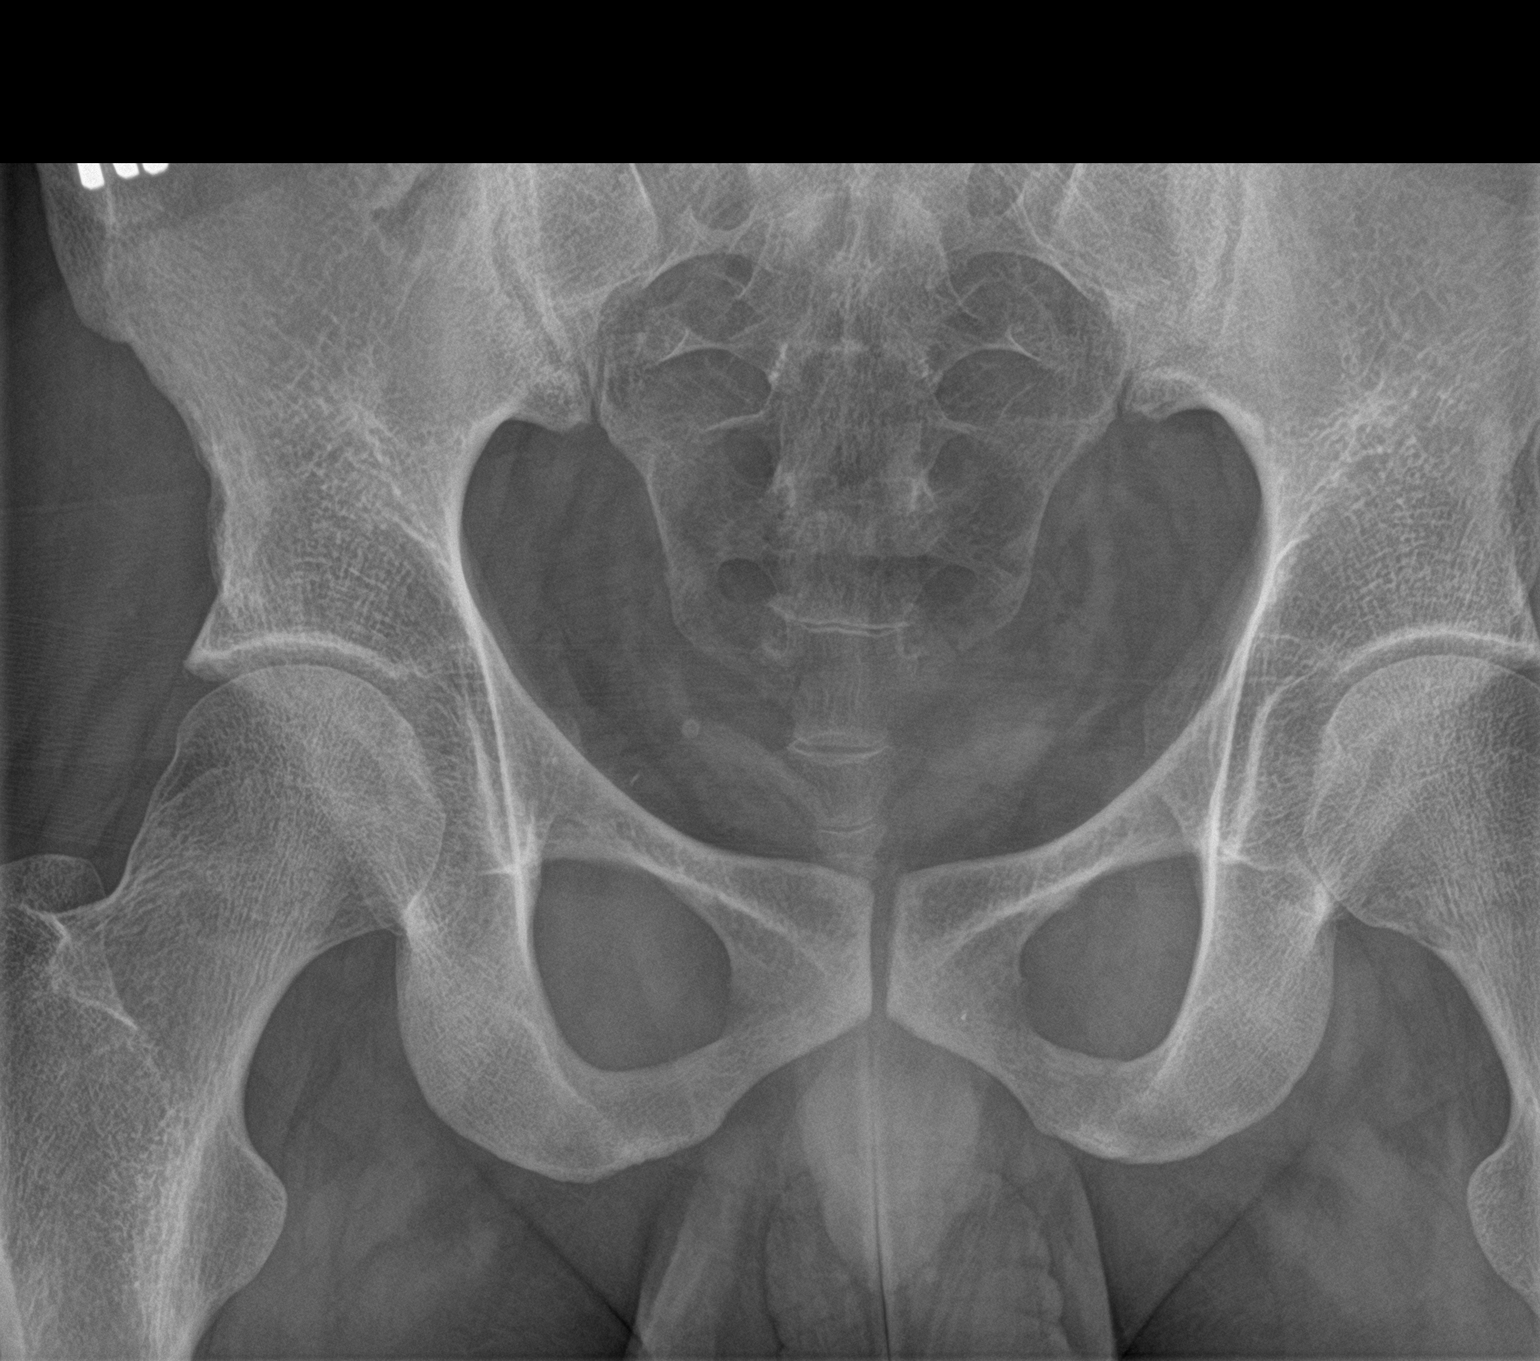

[1 of 1 positions shown; findings below may reference images not displayed]

FINDINGS: 5 mm calculus in the proximal left ureter at the level of left L3
transverse process. Right pelvic phlebolith.

Normal bowel gas pattern. Regional bones unremarkable.
IMPRESSION: Proximal left ureteral calculus.
